# Patient Record
Sex: Male | Born: 1970 | Hispanic: No | Marital: Single | Smoking: Former smoker
Health system: Southern US, Community
[De-identification: ages and names within clinical notes are randomized; demographics above are authoritative.]

---

## 2002-09-20 ENCOUNTER — Emergency Department (HOSPITAL_COMMUNITY): Admission: EM | Admit: 2002-09-20 | Discharge: 2002-09-20 | Payer: Self-pay | Admitting: Emergency Medicine

## 2002-09-20 ENCOUNTER — Encounter: Payer: Self-pay | Admitting: Emergency Medicine

## 2011-04-04 ENCOUNTER — Emergency Department (HOSPITAL_COMMUNITY): Payer: Self-pay

## 2011-04-04 ENCOUNTER — Emergency Department (HOSPITAL_COMMUNITY)
Admission: EM | Admit: 2011-04-04 | Discharge: 2011-04-05 | Disposition: A | Discharge status: Home / Self Care | Payer: No Typology Code available for payment source | Attending: Emergency Medicine | Admitting: Emergency Medicine

## 2011-04-04 DIAGNOSIS — R071 Chest pain on breathing: Secondary | ICD-10-CM | POA: Insufficient documentation

## 2011-04-04 DIAGNOSIS — S20219A Contusion of unspecified front wall of thorax, initial encounter: Secondary | ICD-10-CM | POA: Insufficient documentation
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
## 2011-04-04 NOTE — ED Notes (Signed)
[employer] at bedside. Language line being accessed. PT resting; cardiac monitor on. VSS.

## 2011-04-04 NOTE — ED Notes (Addendum)
Language line interpretation unsuccessful. Pt speaks [place] dialect and language line only has traditional [place] interpreter.

## 2011-04-05 MED ORDER — HYDROCODONE-ACETAMINOPHEN 5-325 MG PO TABS: 2.0000 | ORAL_TABLET | ORAL | Status: AC | PRN

## 2011-04-05 NOTE — ED Notes (Addendum)
RN tried another attempt to communicate with pt via language line with moderate success. Discharge instructions given. No questions at this time.

## 2011-04-05 NOTE — ED Notes (Signed)
Pt ambulated with a steady gait; VSS; no signs of distress; skin warm and dry; respirations even and unlabored.

## 2011-04-11 ENCOUNTER — Emergency Department (HOSPITAL_COMMUNITY)
Admission: EM | Admit: 2011-04-11 | Discharge: 2011-04-11 | Disposition: A | Discharge status: Home / Self Care | Payer: No Typology Code available for payment source | Attending: Emergency Medicine | Admitting: Emergency Medicine

## 2011-04-11 ENCOUNTER — Encounter (HOSPITAL_COMMUNITY): Payer: Self-pay | Admitting: *Deleted

## 2011-04-11 DIAGNOSIS — M542 Cervicalgia: Secondary | ICD-10-CM | POA: Insufficient documentation

## 2011-04-11 DIAGNOSIS — M62838 Other muscle spasm: Secondary | ICD-10-CM

## 2011-04-11 DIAGNOSIS — M25519 Pain in unspecified shoulder: Secondary | ICD-10-CM | POA: Insufficient documentation

## 2011-04-11 DIAGNOSIS — R209 Unspecified disturbances of skin sensation: Secondary | ICD-10-CM | POA: Insufficient documentation

## 2011-04-11 DIAGNOSIS — R079 Chest pain, unspecified: Secondary | ICD-10-CM | POA: Insufficient documentation

## 2011-04-11 MED ORDER — CYCLOBENZAPRINE HCL 10 MG PO TABS: 10.0000 mg | ORAL_TABLET | Freq: Two times a day (BID) | ORAL | Status: AC | PRN

## 2011-04-11 MED ORDER — NAPROXEN 500 MG PO TABS: 500.0000 mg | ORAL_TABLET | Freq: Two times a day (BID) | ORAL | Status: AC
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
## 2015-08-29 ENCOUNTER — Encounter (HOSPITAL_COMMUNITY): Payer: Self-pay | Admitting: Adult Health

## 2015-08-29 DIAGNOSIS — Z87891 Personal history of nicotine dependence: Secondary | ICD-10-CM | POA: Diagnosis not present

## 2015-08-29 DIAGNOSIS — S6401XA Injury of ulnar nerve at wrist and hand level of right arm, initial encounter: Secondary | ICD-10-CM | POA: Diagnosis not present

## 2015-08-29 DIAGNOSIS — S56521A Laceration of other extensor muscle, fascia and tendon at forearm level, right arm, initial encounter: Secondary | ICD-10-CM | POA: Diagnosis not present

## 2015-08-29 DIAGNOSIS — S6421XA Injury of radial nerve at wrist and hand level of right arm, initial encounter: Secondary | ICD-10-CM | POA: Diagnosis not present

## 2015-08-29 DIAGNOSIS — W25XXXA Contact with sharp glass, initial encounter: Secondary | ICD-10-CM | POA: Insufficient documentation

## 2015-08-29 DIAGNOSIS — S51811A Laceration without foreign body of right forearm, initial encounter: Secondary | ICD-10-CM | POA: Diagnosis present

## 2015-08-29 NOTE — ED Notes (Signed)
preswnts with laceration to right posterior forearm CMS intact, occurred from a window. Unknown tetanus.

## 2015-08-30 ENCOUNTER — Ambulatory Visit (HOSPITAL_COMMUNITY)
Admission: EM | Admit: 2015-08-30 | Discharge: 2015-08-30 | Disposition: A | Discharge status: Home / Self Care | Payer: PRIVATE HEALTH INSURANCE | Attending: Emergency Medicine | Admitting: Emergency Medicine

## 2015-08-30 ENCOUNTER — Emergency Department (HOSPITAL_COMMUNITY): Payer: PRIVATE HEALTH INSURANCE

## 2015-08-30 ENCOUNTER — Emergency Department (HOSPITAL_COMMUNITY): Payer: PRIVATE HEALTH INSURANCE | Admitting: Anesthesiology

## 2015-08-30 ENCOUNTER — Encounter (HOSPITAL_COMMUNITY)
Admission: EM | Disposition: A | Discharge status: Home / Self Care | Payer: Self-pay | Source: Home / Self Care | Attending: Emergency Medicine

## 2015-08-30 ENCOUNTER — Encounter (HOSPITAL_COMMUNITY): Payer: Self-pay | Admitting: Critical Care Medicine

## 2015-08-30 DIAGNOSIS — S51811A Laceration without foreign body of right forearm, initial encounter: Secondary | ICD-10-CM

## 2015-08-30 DIAGNOSIS — S56921A Laceration of unspecified muscles, fascia and tendons at forearm level, right arm, initial encounter: Secondary | ICD-10-CM

## 2015-08-30 HISTORY — PX: TENDON REPAIR: SHX5111

## 2015-08-30 SURGERY — TENDON REPAIR
Anesthesia: General | Site: Arm Lower | Laterality: Right

## 2015-08-30 MED ORDER — LIDOCAINE HCL (CARDIAC) 20 MG/ML IV SOLN
INTRAVENOUS | Status: DC | PRN
  Administered 2015-08-30: 50 mg via INTRAVENOUS

## 2015-08-30 MED ORDER — SUCCINYLCHOLINE CHLORIDE 200 MG/10ML IV SOSY
PREFILLED_SYRINGE | INTRAVENOUS | Status: AC
  Filled 2015-08-30: qty 10

## 2015-08-30 MED ORDER — LACTATED RINGERS IV SOLN
INTRAVENOUS | Status: DC | PRN
  Administered 2015-08-30: 07:00:00 via INTRAVENOUS

## 2015-08-30 MED ORDER — LIDOCAINE 2% (20 MG/ML) 5 ML SYRINGE
INTRAMUSCULAR | Status: AC
  Filled 2015-08-30: qty 5

## 2015-08-30 MED ORDER — ACETAMINOPHEN 160 MG/5ML PO SOLN
325.0000 mg | ORAL | Status: DC | PRN
  Filled 2015-08-30: qty 20.3

## 2015-08-30 MED ORDER — FENTANYL CITRATE (PF) 100 MCG/2ML IJ SOLN
INTRAMUSCULAR | Status: DC | PRN
  Administered 2015-08-30: 100 ug via INTRAVENOUS

## 2015-08-30 MED ORDER — MIDAZOLAM HCL 2 MG/2ML IJ SOLN
INTRAMUSCULAR | Status: AC
  Filled 2015-08-30: qty 2

## 2015-08-30 MED ORDER — FENTANYL CITRATE (PF) 100 MCG/2ML IJ SOLN: 25.0000 ug | INTRAMUSCULAR | Status: DC | PRN

## 2015-08-30 MED ORDER — CEFAZOLIN SODIUM 1 G IJ SOLR
INTRAMUSCULAR | Status: DC | PRN
  Administered 2015-08-30: 2 g via INTRAMUSCULAR

## 2015-08-30 MED ORDER — OXYCODONE-ACETAMINOPHEN 5-325 MG PO TABS: 1.0000 | ORAL_TABLET | ORAL | Status: DC | PRN

## 2015-08-30 MED ORDER — LIDOCAINE-EPINEPHRINE (PF) 1 %-1:200000 IJ SOLN
20.0000 mL | Freq: Once | INTRAMUSCULAR | Status: DC
  Filled 2015-08-30: qty 20

## 2015-08-30 MED ORDER — ONDANSETRON HCL 4 MG/2ML IJ SOLN
INTRAMUSCULAR | Status: AC
  Filled 2015-08-30: qty 2

## 2015-08-30 MED ORDER — ONDANSETRON HCL 4 MG/2ML IJ SOLN
INTRAMUSCULAR | Status: DC | PRN
  Administered 2015-08-30: 4 mg via INTRAVENOUS

## 2015-08-30 MED ORDER — PROPOFOL 10 MG/ML IV BOLUS
INTRAVENOUS | Status: AC
  Filled 2015-08-30: qty 20

## 2015-08-30 MED ORDER — BUPIVACAINE HCL (PF) 0.25 % IJ SOLN
INTRAMUSCULAR | Status: AC
  Filled 2015-08-30: qty 30

## 2015-08-30 MED ORDER — DEXAMETHASONE SODIUM PHOSPHATE 10 MG/ML IJ SOLN
INTRAMUSCULAR | Status: AC
  Filled 2015-08-30: qty 1

## 2015-08-30 MED ORDER — ACETAMINOPHEN 325 MG PO TABS: 325.0000 mg | ORAL_TABLET | ORAL | Status: DC | PRN

## 2015-08-30 MED ORDER — FENTANYL CITRATE (PF) 250 MCG/5ML IJ SOLN
INTRAMUSCULAR | Status: AC
  Filled 2015-08-30: qty 5

## 2015-08-30 MED ORDER — OXYCODONE-ACETAMINOPHEN 5-325 MG PO TABS
ORAL_TABLET | ORAL | Status: AC
  Administered 2015-08-30: 1
  Filled 2015-08-30: qty 1

## 2015-08-30 MED ORDER — PHENYLEPHRINE HCL 10 MG/ML IJ SOLN
INTRAMUSCULAR | Status: DC | PRN
  Administered 2015-08-30 (×5): 80 ug via INTRAVENOUS

## 2015-08-30 MED ORDER — BUPIVACAINE HCL (PF) 0.25 % IJ SOLN
INTRAMUSCULAR | Status: DC | PRN
  Administered 2015-08-30: 20 mL

## 2015-08-30 MED ORDER — DEXAMETHASONE SODIUM PHOSPHATE 10 MG/ML IJ SOLN
INTRAMUSCULAR | Status: DC | PRN
  Administered 2015-08-30: 10 mg via INTRAVENOUS

## 2015-08-30 MED ORDER — PHENYLEPHRINE 40 MCG/ML (10ML) SYRINGE FOR IV PUSH (FOR BLOOD PRESSURE SUPPORT)
PREFILLED_SYRINGE | INTRAVENOUS | Status: AC
  Filled 2015-08-30: qty 10

## 2015-08-30 MED ORDER — PROPOFOL 10 MG/ML IV BOLUS
INTRAVENOUS | Status: DC | PRN
Start: 2015-08-30 — End: 2015-08-30
  Administered 2015-08-30: 20 mg via INTRAVENOUS
  Administered 2015-08-30: 110 mg via INTRAVENOUS
  Administered 2015-08-30: 40 mg via INTRAVENOUS

## 2015-08-30 MED ORDER — OXYCODONE HCL 5 MG PO TABS: 5.0000 mg | ORAL_TABLET | Freq: Once | ORAL | Status: DC | PRN

## 2015-08-30 MED ORDER — OXYCODONE HCL 5 MG/5ML PO SOLN: 5.0000 mg | Freq: Once | ORAL | Status: DC | PRN

## 2015-08-30 MED ORDER — SUCCINYLCHOLINE CHLORIDE 20 MG/ML IJ SOLN
INTRAMUSCULAR | Status: DC | PRN
  Administered 2015-08-30: 40 mg via INTRAVENOUS

## 2015-08-30 MED ORDER — TETANUS-DIPHTH-ACELL PERTUSSIS 5-2.5-18.5 LF-MCG/0.5 IM SUSP
0.5000 mL | Freq: Once | INTRAMUSCULAR | Status: AC
  Administered 2015-08-30: 0.5 mL via INTRAMUSCULAR
  Filled 2015-08-30: qty 0.5

## 2015-08-30 MED ORDER — ROCURONIUM BROMIDE 50 MG/5ML IV SOLN
INTRAVENOUS | Status: AC
  Filled 2015-08-30: qty 1

## 2015-08-30 MED ORDER — HYDROMORPHONE HCL 1 MG/ML IJ SOLN
1.0000 mg | Freq: Once | INTRAMUSCULAR | Status: AC
  Administered 2015-08-30: 1 mg via INTRAVENOUS
  Filled 2015-08-30: qty 1

## 2015-08-30 MED ORDER — ONDANSETRON HCL 4 MG/2ML IJ SOLN
4.0000 mg | Freq: Once | INTRAMUSCULAR | Status: AC
  Administered 2015-08-30: 4 mg via INTRAVENOUS
  Filled 2015-08-30: qty 2

## 2015-08-30 SURGICAL SUPPLY — 60 items
BANDAGE ACE 3X5.8 VEL STRL LF (GAUZE/BANDAGES/DRESSINGS) ×2 IMPLANT
BANDAGE ELASTIC 3 VELCRO ST LF (GAUZE/BANDAGES/DRESSINGS) IMPLANT
BANDAGE ELASTIC 4 VELCRO ST LF (GAUZE/BANDAGES/DRESSINGS) IMPLANT
BNDG COHESIVE 1X5 TAN STRL LF (GAUZE/BANDAGES/DRESSINGS) IMPLANT
BNDG ESMARK 4X9 LF (GAUZE/BANDAGES/DRESSINGS) ×2 IMPLANT
BNDG GAUZE ELAST 4 BULKY (GAUZE/BANDAGES/DRESSINGS) IMPLANT
CORDS BIPOLAR (ELECTRODE) ×2 IMPLANT
COVER SURGICAL LIGHT HANDLE (MISCELLANEOUS) ×2 IMPLANT
CUFF TOURNIQUET SINGLE 18IN (TOURNIQUET CUFF) IMPLANT
DECANTER SPIKE VIAL GLASS SM (MISCELLANEOUS) ×2 IMPLANT
DRAPE OEC MINIVIEW 54X84 (DRAPES) IMPLANT
DRAPE SURG 17X23 STRL (DRAPES) ×2 IMPLANT
GAUZE SPONGE 2X2 8PLY STRL LF (GAUZE/BANDAGES/DRESSINGS) IMPLANT
GAUZE SPONGE 4X4 12PLY STRL (GAUZE/BANDAGES/DRESSINGS) ×2 IMPLANT
GAUZE XEROFORM 1X8 LF (GAUZE/BANDAGES/DRESSINGS) ×2 IMPLANT
GLOVE BIO SURGEON STRL SZ7.5 (GLOVE) ×2 IMPLANT
GLOVE BIOGEL PI IND STRL 7.0 (GLOVE) ×1 IMPLANT
GLOVE BIOGEL PI IND STRL 8 (GLOVE) ×1 IMPLANT
GLOVE BIOGEL PI INDICATOR 7.0 (GLOVE) ×1
GLOVE BIOGEL PI INDICATOR 8 (GLOVE) ×1
GOWN STRL REUS W/ TWL LRG LVL3 (GOWN DISPOSABLE) ×2 IMPLANT
GOWN STRL REUS W/ TWL XL LVL3 (GOWN DISPOSABLE) ×1 IMPLANT
GOWN STRL REUS W/TWL LRG LVL3 (GOWN DISPOSABLE) ×2
GOWN STRL REUS W/TWL XL LVL3 (GOWN DISPOSABLE) ×1
GUIDE NRV 3X2RSRB NEURAGEN 2M (Tissue) ×1 IMPLANT
KIT BASIN OR (CUSTOM PROCEDURE TRAY) ×2 IMPLANT
KIT ROOM TURNOVER OR (KITS) ×2 IMPLANT
MANIFOLD NEPTUNE II (INSTRUMENTS) IMPLANT
NEEDLE HYPO 25GX1X1/2 BEV (NEEDLE) ×4 IMPLANT
NERVE GUIDE NEURAGEN 2MM (Tissue) ×1 IMPLANT
NS IRRIG 1000ML POUR BTL (IV SOLUTION) ×2 IMPLANT
PACK ORTHO EXTREMITY (CUSTOM PROCEDURE TRAY) ×2 IMPLANT
PAD ARMBOARD 7.5X6 YLW CONV (MISCELLANEOUS) ×4 IMPLANT
PAD CAST 3X4 CTTN HI CHSV (CAST SUPPLIES) ×1 IMPLANT
PAD CAST 4YDX4 CTTN HI CHSV (CAST SUPPLIES) IMPLANT
PADDING CAST ABS 4INX4YD NS (CAST SUPPLIES) ×1
PADDING CAST ABS COTTON 4X4 ST (CAST SUPPLIES) ×1 IMPLANT
PADDING CAST COTTON 3X4 STRL (CAST SUPPLIES) ×1
PADDING CAST COTTON 4X4 STRL (CAST SUPPLIES)
PENCIL BUTTON BLDE SNGL 10FT (ELECTRODE) ×2 IMPLANT
SOAP 2 % CHG 4 OZ (WOUND CARE) ×2 IMPLANT
SPECIMEN JAR SMALL (MISCELLANEOUS) IMPLANT
SPLINT FIBERGLASS 3X12 (CAST SUPPLIES) ×2 IMPLANT
SPONGE GAUZE 2X2 STER 10/PKG (GAUZE/BANDAGES/DRESSINGS)
SPONGE SCRUB IODOPHOR (GAUZE/BANDAGES/DRESSINGS) IMPLANT
SUCTION FRAZIER HANDLE 10FR (MISCELLANEOUS) ×1
SUCTION TUBE FRAZIER 10FR DISP (MISCELLANEOUS) ×1 IMPLANT
SUT ETHILON 4 0 PS 2 18 (SUTURE) ×4 IMPLANT
SUT ETHILON 8 0 BV130 4 (SUTURE) ×4 IMPLANT
SUT FIBERWIRE 3-0 18 TAPR NDL (SUTURE) ×2
SUT MERSILENE 4 0 P 3 (SUTURE) IMPLANT
SUT PROLENE 4 0 PS 2 18 (SUTURE) IMPLANT
SUT VIC AB 2-0 CT1 27 (SUTURE)
SUT VIC AB 2-0 CT1 TAPERPNT 27 (SUTURE) IMPLANT
SUTURE FIBERWR 3-0 18 TAPR NDL (SUTURE) ×1 IMPLANT
SYR CONTROL 10ML LL (SYRINGE) ×2 IMPLANT
TOWEL OR 17X24 6PK STRL BLUE (TOWEL DISPOSABLE) ×2 IMPLANT
TOWEL OR 17X26 10 PK STRL BLUE (TOWEL DISPOSABLE) ×2 IMPLANT
TUBE CONNECTING 12X1/4 (SUCTIONS) IMPLANT
UNDERPAD 30X30 INCONTINENT (UNDERPADS AND DIAPERS) ×2 IMPLANT

## 2015-08-30 NOTE — Op Note (Signed)
NAME:  Bruce Hawkins, Y                       ACCOUNT NO.:  [id number]  MEDICAL RECORD NO.:  [id number]  LOCATION:  [facility]                         FACILITY:  [facility]  PHYSICIAN:  Johnette AbrahamHarrill C Torria Fromer, MD    DATE OF BIRTH:  09/10/1970  DATE OF PROCEDURE:  08/30/2015 DATE OF DISCHARGE:                              OPERATIVE REPORT   PREOPERATIVE DIAGNOSIS:  Complex lacerations to the right wrist and right forearm with presumed tendon and nerve damage.  POSTOPERATIVE DIAGNOSIS:  Complex lacerations to the right wrist and right forearm with presumed tendon and nerve damage.  PROCEDURE:  Exploration of complex wounds x2. Repair of extensor carpi radialis tendon, repair of the extensor pollicis brevis tendon, repair of radial sensory nerve, and limited forearm fasciotomy.  ANESTHESIA:  General.  No acute complications.  INDICATIONS:  Mr. Marga HootsSiu is a 45 year old gentleman who was opening a plate glass window when somehow his hand slipped through the glass broke.  His hand went through the glass sustaining complex lacerations as described above.  All risks, benefits, alternatives of surgery and fixation were discussed with him and his family.  He agreed with this course of action.  Consent was obtained.  PROCEDURE IN DETAIL:  The patient was taken to the operating room, placed supine on the operating table.  Time-out was performed.  General anesthesia was administered without difficulty.  The right upper extremity was prepped and draped in normal sterile fashion.  Tourniquet was used.  The arm was exsanguinated.  Tourniquet was inflated to 250 mmHg.  The radial most wound was explored first.  There was a very stellate gaping wound.  It was obvious that there were 2 tendon structures that were damaged.  It appeared to be that the extensor pollicis brevis tendon as well as the extensor carpi radialis tendons were about 70% lacerated.  These tendons were freed up and repaired with 3-0 FiberWire.  Next, it  was evident that the radial sensory nerve as it exited the deep fascia was also completely lacerated.  The proximal end was frayed and therefore not suitable for primary repair.  A NeuraGen 2 mm x 3 cm conduit tube was used.  Both ends of the nerve were trimmed and placed in the tube and then the nerve was sutured to the tube holding it in place.  Following the more volar wound was explored.  This was right over the ulnar artery and ulnar nerve.  The ulnar nerve was visualized and somewhat bruised.  However, it was intact.  The tendinous structures around this wound were also explored and the flexor tendons were without injury.  Both wounds were irrigated with irrigation solution and local advancement flaps were used to close the stellate lacerations that were very complex in nature.  These wounds were closed in layers. After general full-thickness debridement and washout was performed, sterile dressing and a splint were applied.  After the tourniquet was released, all finger tips were nice and pink.     Johnette AbrahamHarrill C Meliana Canner, MD     HCC/MEDQ  D:  08/30/2015  T:  08/30/2015  Job:  [id number]

## 2015-08-30 NOTE — Anesthesia Postprocedure Evaluation (Signed)
Anesthesia Post Note  Patient: Bruce Hawkins  Procedure(s) Performed: Procedure(s) (LRB): EXPLORATION OF WOUND POSSIBLE RIGHT TENDON REPAIR AS NEEDED (Right)  Patient location during evaluation: PACU Anesthesia Type: General Level of consciousness: awake, awake and alert and oriented Pain management: pain level controlled Vital Signs Assessment: post-procedure vital signs reviewed and stable Respiratory status: spontaneous breathing, nonlabored ventilation and respiratory function stable Cardiovascular status: blood pressure returned to baseline Anesthetic complications: no    Last Vitals:  Filed Vitals:   08/30/15 0900 08/30/15 0902  BP:  141/96  Pulse: 73 72  Temp: 36.6 C   Resp: 16 16    Last Pain:  Filed Vitals:   08/30/15 0939  PainSc: Asleep                 Meighan Treto COKER
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
## 2015-08-30 NOTE — Anesthesia Procedure Notes (Signed)
Procedure Name: Intubation Date/Time: 08/30/2015 6:53 AM Performed by: Glo HerringLEE, Mazikeen Hehn B Pre-anesthesia Checklist: Patient identified, Emergency Drugs available, Timeout performed, Patient being monitored and Suction available Patient Re-evaluated:Patient Re-evaluated prior to inductionOxygen Delivery Method: Circle system utilized Preoxygenation: Pre-oxygenation with 100% oxygen Intubation Type: IV induction Ventilation: Mask ventilation without difficulty Laryngoscope Size: Mac and 3 Grade View: Grade I Tube type: Oral Tube size: 7.0 mm Number of attempts: 1 Airway Equipment and Method: Stylet Placement Confirmation: CO2 detector,  positive ETCO2,  ETT inserted through vocal cords under direct vision and breath sounds checked- equal and bilateral Secured at: 21 cm Tube secured with: Tape Dental Injury: Teeth and Oropharynx as per pre-operative assessment

## 2015-08-30 NOTE — Anesthesia Preprocedure Evaluation (Signed)
Anesthesia Evaluation  Patient identified by MRN, date of birth, ID band Patient awake    Reviewed: Allergy & Precautions, NPO status , Patient's Chart, lab work & pertinent test results  History of Anesthesia Complications (+) history of anesthetic complications  Airway Mallampati: II  TM Distance: >3 FB Neck ROM: Full    Dental  (+) Teeth Intact   Pulmonary former smoker,    breath sounds clear to auscultation       Cardiovascular negative cardio ROS   Rhythm:Regular     Neuro/Psych negative neurological ROS  negative psych ROS   GI/Hepatic negative GI ROS, Neg liver ROS,   Endo/Other  negative endocrine ROS  Renal/GU negative Renal ROS     Musculoskeletal   Abdominal   Peds  Hematology negative hematology ROS (+)   Anesthesia Other Findings   Reproductive/Obstetrics                             Anesthesia Physical Anesthesia Plan  ASA: I  Anesthesia Plan: General   Post-op Pain Management:    Induction: Intravenous and Rapid sequence  Airway Management Planned: Oral ETT  Additional Equipment: None  Intra-op Plan:   Post-operative Plan: Extubation in OR  Informed Consent: I have reviewed the patients History and Physical, chart, labs and discussed the procedure including the risks, benefits and alternatives for the proposed anesthesia with the patient or authorized representative who has indicated his/her understanding and acceptance.   Dental advisory given  Plan Discussed with: Surgeon and CRNA  Anesthesia Plan Comments:         Anesthesia Quick Evaluation

## 2015-08-30 NOTE — H&P (Signed)
Reason for Consult:laceration Referring Physician: ER  CC:I cut my arm  HPI:  Bruce Hawkins is an 45 Bruce.o. right handed male who presents with  Complex lacerations to r forearm.  Pt was opening a window when hand went through glass .   Pain is rated at  10  /10 and is described as sharp.  Pain is constant.  Pain is made better by rest/immobilization, worse with motion.   Associated signs/symptoms: denies numbness Previous treatment:    History reviewed. No pertinent past medical history.  History reviewed. No pertinent past surgical history.  History reviewed. No pertinent family history.  Social History:  reports that he has quit smoking. He does not have any smokeless tobacco history on file. He reports that he does not drink alcohol or use illicit drugs.  Allergies: No Known Allergies  Medications: I have reviewed the patient's current medications.  No results found for this or any previous visit (from the past 48 hour(s)).  Dg Wrist Complete Right  08/30/2015  CLINICAL DATA:  Wrist laceration from window. EXAM: RIGHT WRIST - COMPLETE 3+ VIEW COMPARISON:  None. FINDINGS: There is no evidence of fracture or dislocation. There is no evidence of arthropathy or other focal bone abnormality. Soft tissue defect volar aspect is of distal radius soft tissues with subcutaneous gas. IMPRESSION: Distal radial soft tissue laceration without acute osseous process. Electronically Signed   By: Awilda Metroourtnay  Bloomer M.D.   On: 08/30/2015 05:05    Pertinent items are noted in HPI. Temp:  [98.2 F (36.8 C)] 98.2 F (36.8 C) (05/20 2325) Pulse Rate:  [71] 71 (05/20 2325) Resp:  [18] 18 (05/20 2325) BP: (103)/(73) 103/73 mmHg (05/20 2325) SpO2:  [97 %] 97 % (05/20 2325) Weight:  [64.609 kg (142 lb 7 oz)] 64.609 kg (142 lb 7 oz) (05/20 2325) General appearance: alert and cooperative Resp: clear to auscultation bilaterally Cardio: regular rate and rhythm GI: soft, non-tender; bowel sounds normal; no  masses,  no organomegaly Extremities: extremities normal, atraumatic, no cyanosis or edema  Except for R forearm: deep complex laceration of distal radial forearm, and ulnar volar wrist, able to flex extend all digits, figner tips pink with good cap refill   Assessment: Complex lacerations of R forearm/wrist, ? Tendon involvement Plan: Will wash out, explore, repair damaged structures as needed. I have discussed this treatment plan in detail with patient and family, including the risks of the recommended treatment or surgery, the benefits and the alternatives.  The patient and caregiver understands that additional treatment may be necessary.  Bruce Hawkins Bruce Hawkins 08/30/2015, 6:47 AM

## 2015-08-30 NOTE — Transfer of Care (Signed)
Immediate Anesthesia Transfer of Care Note  Patient: Bruce Hawkins  Procedure(s) Performed: Procedure(s): EXPLORATION OF WOUND POSSIBLE RIGHT TENDON REPAIR AS NEEDED (Right)  Patient Location: PACU  Anesthesia Type:General  Level of Consciousness: awake and alert   Airway & Oxygen Therapy: Patient Spontanous Breathing and Patient connected to nasal cannula oxygen  Post-op Assessment: Report given to RN and Post -op Vital signs reviewed and stable  Post vital signs: Reviewed and stable  Last Vitals:  Filed Vitals:   08/29/15 2325  BP: 103/73  Pulse: 71  Temp: 36.8 C  Resp: 18    Last Pain:  Filed Vitals:   08/30/15 0426  PainSc: 10-Worst pain ever         Complications: No apparent anesthesia complications

## 2015-08-30 NOTE — Discharge Instructions (Signed)
Discharge Instructions:  Keep your dressing clean, dry and in place until instructed to remove by Dr. Samyah Bilbo.  If the dressing becomes dirty or wet call the office for instructions during business hours. Elevate the extremity to help with swelling, this will also help with any discomfort. Take your medication as prescribed. No lifting with the injured  extremity. If you feel that the dressing is too tight, you may loosen it, but keep it on; finger tips should be pink; if there is a concern, call the office. [phone] Ice may be used if the injury is a fracture, do not apply ice directly to the skin. Please call the office on the next business day after discharge to arrange a follow up appointment.  Call [phone] between the hours of 9am - 5pm M-Th or 9am - 1pm on Fri. For most hand injuries and/or conditions, you may return to work using the uninjured hand (one handed duty) within 24-72 hours.  A detailed note will be provided to you at your follow up appointment or may contact the office prior to your follow up.    

## 2015-09-01 ENCOUNTER — Encounter (HOSPITAL_COMMUNITY): Payer: Self-pay | Admitting: General Surgery

## 2015-09-14 ENCOUNTER — Emergency Department (HOSPITAL_COMMUNITY)
Admission: EM | Admit: 2015-09-14 | Discharge: 2015-09-14 | Disposition: A | Discharge status: Home / Self Care | Payer: PRIVATE HEALTH INSURANCE | Attending: Emergency Medicine | Admitting: Emergency Medicine

## 2015-09-14 DIAGNOSIS — S59911D Unspecified injury of right forearm, subsequent encounter: Secondary | ICD-10-CM | POA: Diagnosis not present

## 2015-09-14 DIAGNOSIS — S6992XD Unspecified injury of left wrist, hand and finger(s), subsequent encounter: Secondary | ICD-10-CM | POA: Diagnosis present

## 2015-09-14 DIAGNOSIS — Z5189 Encounter for other specified aftercare: Secondary | ICD-10-CM

## 2015-09-14 DIAGNOSIS — Z4801 Encounter for change or removal of surgical wound dressing: Secondary | ICD-10-CM | POA: Insufficient documentation

## 2015-09-14 DIAGNOSIS — S61511D Laceration without foreign body of right wrist, subsequent encounter: Secondary | ICD-10-CM

## 2015-09-14 DIAGNOSIS — W268XXD Contact with other sharp object(s), not elsewhere classified, subsequent encounter: Secondary | ICD-10-CM | POA: Insufficient documentation

## 2015-09-14 NOTE — Discharge Instructions (Signed)
Go to Dr. Debby Budoley's office in two days (on Wednesday June 7th, 2017) at 3:30PM for a recheck of your wound.

## 2015-09-14 NOTE — ED Notes (Signed)
Declined W/C at D/C and was escorted to lobby by RN. 

## 2015-09-14 NOTE — ED Provider Notes (Signed)
CSN: [id number]     Arrival date & time 09/14/15  [id number] History  By signing my name below, I, Hollace HaywardAndrew Hiatt, attest that this documentation has been prepared under the direction and in the presence of Renne CriglerJoshua Altagracia Rone, PA-C.   Electronically Signed: Hollace HaywardAndrew Hiatt, ED Scribe. 09/14/2015. 9:26 AM.  Chief Complaint  Patient presents with  . Hand Injury   The history is provided by the patient. The history is limited by a language barrier. A language interpreter was used [place]).   HPI Comments: Bruce Hawkins is a 45 y.o. male who presents to the Emergency Department for a R arm laceration recheck from an injury that occured 08/30/2015. Pt reports that he stuck his hand through a broken window when he sustained the laceration to his R arm.Pt was seen on 08/30/15 in the ED for his initial injuries by this provider. Patient was brought to the operating room for washout of wounds. Patient was found to have 2 tendons that were 70% lacerated as well as nerve laceration.  Pt denies any broken bones. He is currently on antibiotics for his injury. Pt reports that he cannot move his hand full ROM. Pt does not remember the doctor who initially performed his surgery. States that he was supposed to follow-up today. Requesting note to return to work.  No past medical history on file. No past surgical history on file. No family history on file. Social History  Substance Use Topics  . Smoking status: Not on file  . Smokeless tobacco: Not on file  . Alcohol Use: No    Review of Systems  Constitutional: Negative for fever and activity change.  Musculoskeletal: Positive for arthralgias. Negative for back pain, joint swelling, gait problem and neck pain.  Skin: Positive for wound (R forearm).  Neurological: Positive for weakness (R wrist). Negative for numbness.     Allergies  Review of patient's allergies indicates no known allergies.  Home Medications   Prior to Admission medications   Not on File   BP  152/96 mmHg  Pulse 78  Temp(Src) 98.9 F (37.2 C) (Oral)  Resp 16  SpO2 98%   Physical Exam  Constitutional: He appears well-developed and well-nourished.  HENT:  Head: Normocephalic and atraumatic.  Eyes: Conjunctivae are normal.  Neck: Normal range of motion. Neck supple.  Cardiovascular: Normal rate and normal pulses.   Pulmonary/Chest: Effort normal. No respiratory distress.  Abdominal: He exhibits no distension.  Musculoskeletal: He exhibits tenderness. He exhibits no edema.  Neurological: He is alert. No sensory deficit.  Vascular distal to the injury is fully intact. Patient has weakness with flexion and extension of the R wrist.   Skin: Skin is warm and dry.  2 lacerations (radial and volar) well healing with sutures in place. No evidence of infection. No drainage.   Psychiatric: He has a normal mood and affect. His behavior is normal.  Nursing note and vitals reviewed.   ED Course  Procedures (including critical care time)  DIAGNOSTIC STUDIES: Oxygen Saturation is 98% on RA, normal by my interpretation.   COORDINATION OF CARE: 9:20 AM-Discussed next steps with pt including f/u w/ his hand surgeon and continued splint usage. Radial gutter splint replaced by ortho tech. Pt verbalized understanding and is agreeable with the plan.   Vital signs reviewed and are as follows: Filed Vitals:   09/14/15 0911  BP: 152/96  Pulse: 78  Temp: 98.9 F (37.2 C)  Resp: 16   10:16 AM I called Dr. Debby Budoley's office. Patient  was supposed to have follow-up appointment this morning at 9 AM. I have rescheduled a follow-up appointment in 2 days, on 09/16/15, at 3:30 PM. Patient was given the address and phone number of Dr. Debby Bud office as well as the appointment time. He verbalizes understanding that he is supposed to go to this appointment then. Will not clear for return to work.     MDM   Final diagnoses:  Laceration of right wrist with complication, subsequent encounter   Encounter for wound re-check   Patient here for wound recheck. Patient mistakenly came to the emergency department instead of going to the orthopedic surgeon's office. Appointment rescheduled. Wound examined and splint replaced. Patient continues to have weakness in this wrist.  I personally performed the services described in this documentation, which was scribed in my presence. The recorded information has been reviewed and is accurate.     Renne Crigler, PA-C 09/14/15 1017  Lyndal Pulley, MD 09/15/15 [phone]

## 2015-09-14 NOTE — ED Notes (Signed)
Was seen here for lac to  Rt hand now here for recheck

## 2015-09-14 NOTE — Progress Notes (Signed)
Orthopedic Tech Progress Note Patient Details:  Bruce Hawkins 05/22/1970 [id number]  Ortho Devices Type of Ortho Device: Thumb spica splint Ortho Device/Splint Location: rue Ortho Device/Splint Interventions: Application   Ama Mcmaster 09/14/2015, 10:07 AM

## 2016-07-18 ENCOUNTER — Encounter (HOSPITAL_COMMUNITY): Payer: Self-pay | Admitting: *Deleted

## 2017-05-04 ENCOUNTER — Encounter (HOSPITAL_COMMUNITY): Payer: Self-pay | Admitting: Family Medicine

## 2017-05-04 ENCOUNTER — Ambulatory Visit (HOSPITAL_COMMUNITY)
Admission: EM | Admit: 2017-05-04 | Discharge: 2017-05-04 | Disposition: A | Discharge status: Home / Self Care | Payer: PRIVATE HEALTH INSURANCE | Attending: Family Medicine | Admitting: Family Medicine

## 2017-05-04 DIAGNOSIS — G43001 Migraine without aura, not intractable, with status migrainosus: Secondary | ICD-10-CM

## 2017-05-04 MED ORDER — TOPIRAMATE 50 MG PO TABS: 50.0000 mg | ORAL_TABLET | Freq: Every day | ORAL | 1 refills | Status: AC

## 2017-05-04 NOTE — Discharge Instructions (Signed)
Return if headaches persist.

## 2017-05-04 NOTE — ED Provider Notes (Signed)
  [facility]   [id number] 05/04/17 Arrival Time: 1132   SUBJECTIVE:  Bruce Hawkins is a 47 y.o. male who presents to the urgent care with complaint of neck pain that has been radiating into his head for 3 months. sts also fever. sts some nausea. Denies injury to neck.  No head trauma.  Had left eye injury as child  From [place].  Has own interpreter.  History reviewed. No pertinent past medical history. History reviewed. No pertinent family history. Social History   Socioeconomic History  . Marital status: Single    Spouse name: Not on file  . Number of children: Not on file  . Years of education: Not on file  . Highest education level: Not on file  Social Needs  . Financial resource strain: Not on file  . Food insecurity - worry: Not on file  . Food insecurity - inability: Not on file  . Transportation needs - medical: Not on file  . Transportation needs - non-medical: Not on file  Occupational History  . Not on file  Tobacco Use  . Smoking status: Former Smoker  Substance and Sexual Activity  . Alcohol use: No  . Drug use: No  . Sexual activity: Not on file  Other Topics Concern  . Not on file  Social History Narrative   ** Merged History Encounter **       No outpatient medications have been marked as taking for the 05/04/17 encounter [facility](Hospital Encounter).   No Known Allergies    ROS: As per HPI, remainder of ROS negative.   OBJECTIVE:   Vitals:   05/04/17 1206  BP: (!) 149/89  Pulse: 69  Resp: 18  Temp: 98.3 F (36.8 C)  SpO2: 100%     General appearance: alert; no distress Eyes: PERRL; EOMI; conjunctiva normal;  Corneal scar OS; fundi normal HENT: normocephalic; atraumatic; TMs normal, canal normal, external ears normal without trauma; nasal mucosa normal; oral mucosa normal Neck: supple Lungs: clear to auscultation bilaterally Heart: regular rate and rhythm Back: no CVA tenderness Extremities: no cyanosis or edema; symmetrical with  no gross deformities Skin: warm and dry Neurologic: normal gait; grossly normal Psychological: alert and cooperative; normal mood and affect     ASSESSMENT & PLAN:  1. Migraine without aura and with status migrainosus, not intractable     Meds ordered this encounter  Medications  . topiramate (TOPAMAX) 50 MG tablet    Sig: Take 1 tablet (50 mg total) by mouth at bedtime.    Dispense:  30 tablet    Refill:  1    Reviewed expectations re: course of current medical issues. Questions answered. Outlined signs and symptoms indicating need for more acute intervention. Patient verbalized understanding. After Visit Summary given.    Procedures:      Elvina SidleLauenstein, Macayla Ekdahl, MD 05/04/17 1253

## 2017-05-04 NOTE — ED Triage Notes (Signed)
Per pt translator. Pt has neck pain that has been radiating into his head for 3 months. sts also fever. sts some nausea. Denies injury to neck.

## 2020-04-20 ENCOUNTER — Emergency Department (HOSPITAL_COMMUNITY)
Admission: EM | Admit: 2020-04-20 | Discharge: 2020-04-20 | Disposition: A | Discharge status: Home / Self Care | Payer: Self-pay | Attending: Emergency Medicine | Admitting: Emergency Medicine

## 2020-04-20 ENCOUNTER — Emergency Department (HOSPITAL_COMMUNITY): Payer: Self-pay

## 2020-04-20 ENCOUNTER — Encounter (HOSPITAL_COMMUNITY): Payer: Self-pay | Admitting: Emergency Medicine

## 2020-04-20 DIAGNOSIS — M791 Myalgia, unspecified site: Secondary | ICD-10-CM | POA: Insufficient documentation

## 2020-04-20 DIAGNOSIS — Z20822 Contact with and (suspected) exposure to covid-19: Secondary | ICD-10-CM | POA: Insufficient documentation

## 2020-04-20 DIAGNOSIS — R945 Abnormal results of liver function studies: Secondary | ICD-10-CM | POA: Insufficient documentation

## 2020-04-20 DIAGNOSIS — R7989 Other specified abnormal findings of blood chemistry: Secondary | ICD-10-CM

## 2020-04-20 DIAGNOSIS — R519 Headache, unspecified: Secondary | ICD-10-CM | POA: Insufficient documentation

## 2020-04-20 DIAGNOSIS — B349 Viral infection, unspecified: Secondary | ICD-10-CM

## 2020-04-20 DIAGNOSIS — R109 Unspecified abdominal pain: Secondary | ICD-10-CM | POA: Insufficient documentation

## 2020-04-20 DIAGNOSIS — Z87891 Personal history of nicotine dependence: Secondary | ICD-10-CM | POA: Insufficient documentation

## 2020-04-20 LAB — CBC
HCT: 47.7 % (ref 39.0–52.0)
Hemoglobin: 16.2 g/dL (ref 13.0–17.0)
MCH: 28.2 pg (ref 26.0–34.0)
MCHC: 34 g/dL (ref 30.0–36.0)
MCV: 83 fL (ref 80.0–100.0)
Platelets: 228 K/uL (ref 150–400)
RBC: 5.75 MIL/uL (ref 4.22–5.81)
RDW: 12.4 % (ref 11.5–15.5)
WBC: 12 K/uL — ABNORMAL HIGH (ref 4.0–10.5)
nRBC: 0 % (ref 0.0–0.2)

## 2020-04-20 LAB — COMPREHENSIVE METABOLIC PANEL WITH GFR
ALT: 265 U/L — ABNORMAL HIGH (ref 0–44)
AST: 143 U/L — ABNORMAL HIGH (ref 15–41)
Albumin: 4.6 g/dL (ref 3.5–5.0)
Alkaline Phosphatase: 91 U/L (ref 38–126)
Anion gap: 22 — ABNORMAL HIGH (ref 5–15)
BUN: 15 mg/dL (ref 6–20)
CO2: 14 mmol/L — ABNORMAL LOW (ref 22–32)
Calcium: 9.2 mg/dL (ref 8.9–10.3)
Chloride: 101 mmol/L (ref 98–111)
Creatinine, Ser: 1.37 mg/dL — ABNORMAL HIGH (ref 0.61–1.24)
GFR, Estimated: >60 mL/min
Glucose, Bld: 104 mg/dL — ABNORMAL HIGH (ref 70–99)
Potassium: 3.2 mmol/L — ABNORMAL LOW (ref 3.5–5.1)
Sodium: 137 mmol/L (ref 135–145)
Total Bilirubin: 1 mg/dL (ref 0.3–1.2)
Total Protein: 8.6 g/dL — ABNORMAL HIGH (ref 6.5–8.1)

## 2020-04-20 LAB — POC SARS CORONAVIRUS 2 AG -  ED: SARS Coronavirus 2 Ag: NEGATIVE

## 2020-04-20 LAB — LIPASE, BLOOD: Lipase: 22 U/L (ref 11–51)

## 2020-04-20 LAB — CK: Total CK: 653 U/L — ABNORMAL HIGH (ref 49–397)

## 2020-04-20 LAB — SARS CORONAVIRUS 2 (TAT 6-24 HRS): SARS Coronavirus 2: NEGATIVE

## 2020-04-20 MED ORDER — IOHEXOL 300 MG/ML  SOLN
100.0000 mL | Freq: Once | INTRAMUSCULAR | Status: AC | PRN
  Administered 2020-04-20: 100 mL via INTRAVENOUS

## 2020-04-20 MED ORDER — SODIUM CHLORIDE 0.9 % IV SOLN: 1000.0000 mL | INTRAVENOUS | Status: DC

## 2020-04-20 MED ORDER — SODIUM CHLORIDE 0.9 % IV BOLUS (SEPSIS)
1000.0000 mL | Freq: Once | INTRAVENOUS | Status: AC
  Administered 2020-04-20: 1000 mL via INTRAVENOUS

## 2020-04-20 MED ORDER — MORPHINE SULFATE (PF) 4 MG/ML IV SOLN
4.0000 mg | Freq: Once | INTRAVENOUS | Status: AC
  Administered 2020-04-20: 4 mg via INTRAVENOUS
  Filled 2020-04-20: qty 1

## 2020-04-20 NOTE — ED Provider Notes (Signed)
[facility] EMERGENCY DEPARTMENT Provider Note   CSN: [id number] Arrival date & time: 04/20/20  0932   [place] translator used  History Chief complaint: Diffuse body pain.  Bruce Hawkins is a 50 y.o. male.  HPI   Patient presented to the ED with complaints of headache and diffuse body pain as well as abdominal pain.  Patient states he had been doing well till this morning he started having pain.  Patient states he hurts all over from his head down.  He is complaining of abdominal pain but specifically states he is hurting all over when asked about his abdomen.  He denies any trouble with cough or sore throat.  No known fevers or chills.  Patient has been vaccinated for COVID.  He has not received his booster test.  History reviewed. No pertinent past medical history.  There are no problems to display for this patient.   Past Surgical History:  Procedure Laterality Date  . TENDON REPAIR Right 08/30/2015   Procedure: EXPLORATION OF WOUND POSSIBLE RIGHT TENDON REPAIR AS NEEDED;  Surgeon: Knute Neu, MD;  Location: [facility] OR;  Service: Plastics;  Laterality: Right;       No family history on file.  Social History   Tobacco Use  . Smoking status: Former Smoker  Substance Use Topics  . Alcohol use: No  . Drug use: No    Home Medications Prior to Admission medications   Medication Sig Start Date End Date Taking? Authorizing Provider  topiramate (TOPAMAX) 50 MG tablet Take 1 tablet (50 mg total) by mouth at bedtime. 05/04/17   Elvina Sidle, MD    Allergies    Patient has no known allergies.  Review of Systems   Review of Systems  All other systems reviewed and are negative.   Physical Exam Updated Vital Signs BP (!) 156/110   Pulse (!) 114   Temp 98.1 F (36.7 C) (Oral)   Resp 16   SpO2 100%   Physical Exam Vitals and nursing note reviewed.  Constitutional:      Appearance: He is well-developed and well-nourished. He is ill-appearing. He is  not toxic-appearing.  HENT:     Head: Normocephalic and atraumatic.     Right Ear: External ear normal.     Left Ear: External ear normal.  Eyes:     General: No scleral icterus.       Right eye: No discharge.        Left eye: No discharge.     Conjunctiva/sclera: Conjunctivae normal.  Neck:     Trachea: No tracheal deviation.  Cardiovascular:     Rate and Rhythm: Normal rate and regular rhythm.     Pulses: Intact distal pulses.  Pulmonary:     Effort: Pulmonary effort is normal. No respiratory distress.     Breath sounds: Normal breath sounds. No stridor. No wheezing or rales.  Abdominal:     General: Bowel sounds are normal. There is no distension.     Palpations: Abdomen is soft.     Tenderness: There is abdominal tenderness. There is no guarding or rebound.     Comments: No peritoneal signs or guarding  Musculoskeletal:        General: No tenderness or edema.     Cervical back: Neck supple.  Skin:    General: Skin is warm and dry.     Findings: No rash.  Neurological:     Mental Status: He is alert.     Cranial Nerves:  No cranial nerve deficit (no facial droop, extraocular movements intact, no slurred speech).     Sensory: No sensory deficit.     Motor: No abnormal muscle tone or seizure activity.     Coordination: Coordination normal.     Deep Tendon Reflexes: Strength normal.  Psychiatric:        Mood and Affect: Mood and affect normal.     ED Results / Procedures / Treatments   Labs (all labs ordered are listed, but only abnormal results are displayed) Labs Reviewed  COMPREHENSIVE METABOLIC PANEL - Abnormal; Notable for the following components:      Result Value   Potassium 3.2 (*)    CO2 14 (*)    Glucose, Bld 104 (*)    Creatinine, Ser 1.37 (*)    Total Protein 8.6 (*)    AST 143 (*)    ALT 265 (*)    Anion gap 22 (*)    All other components within normal limits  CBC - Abnormal; Notable for the following components:   WBC 12.0 (*)    All other  components within normal limits  LIPASE, BLOOD  URINALYSIS, ROUTINE W REFLEX MICROSCOPIC  CK  POC SARS CORONAVIRUS 2 AG -  ED    EKG EKG Interpretation  Date/Time:  Monday April 20 2020 07:45:16 EST Ventricular Rate:  128 PR Interval:  118 QRS Duration: 82 QT Interval:  348 QTC Calculation: 508 R Axis:   70 Text Interpretation: Sinus tachycardia with occasional Premature ventricular complexes Nonspecific ST abnormality Abnormal ECG No previous tracing Confirmed by Linwood Dibbles [phone]) on 04/20/2020 1:28:01 PM   Radiology DG Chest Portable 1 View  Result Date: 04/20/2020 CLINICAL DATA:  Shortness of breath Abdominal pain EXAM: PORTABLE CHEST 1 VIEW COMPARISON:  None. FINDINGS: The heart size and mediastinal contours are within normal limits. Both lungs are clear. The visualized skeletal structures are unremarkable. IMPRESSION: Normal chest radiograph. Electronically Signed   By: Acquanetta Belling M.D.   On: 04/20/2020 09:07    Procedures Procedures (including critical care time)  Medications Ordered in ED Medications  sodium chloride 0.9 % bolus 1,000 mL (has no administration in time range)    Followed by  0.9 %  sodium chloride infusion (has no administration in time range)  morphine 4 MG/ML injection 4 mg (has no administration in time range)    ED Course  I have reviewed the triage vital signs and the nursing notes.  Pertinent labs & imaging results that were available during my care of the patient were reviewed by me and considered in my medical decision making (see chart for details).  Clinical Course as of 04/20/20 1346  Mon Apr 20, 2020  1342 Chest x-ray without acute findings [JK]  1342 White blood cell count elevated 12 [JK]  1343 Rapid COVID test is negative. [JK]  1343 Metabolic panel notable for decreased bicarb as well as elevated LFTs [JK]    Clinical Course User Index [JK] Linwood Dibbles, MD   MDM Rules/Calculators/A&P                         Patient's  diffuse body aches and pains suggestive of possible viral illness such as influenza or COVID.  Rapid COVID test is negative.  Patient has been vaccinated.  Patient does have elevated LFTs.  With patient's complaints of abdominal pain we will proceed with CT scan. If ct negative, sx suggestive of possible viral etiology. Would order confirmatory  covid.  CK added on to rule out acute myositis.  Care turned over to Dr Charm Barges.   Final Clinical Impression(s) / ED Diagnoses pending   Linwood Dibbles, MD 04/20/20 [phone]

## 2020-04-20 NOTE — Discharge Instructions (Addendum)
You were seen in the emergency department for evaluation of generalized pain.  You had lab work, chest x-ray, and a CAT scan of your abdomen and pelvis that did not show an obvious explanation for your symptoms.  Your rapid COVID test was negative and a second COVID test was sent out and should result in the next 24 hours.  You can follow this up in MyChart.  Your LFTs were also mildly elevated and will need to be followed up with your primary care doctor.  Please return to the emergency department if any worsening or concerning symptoms.

## 2020-04-20 NOTE — ED Notes (Signed)
Patient transported to CT 

## 2020-04-20 NOTE — ED Provider Notes (Signed)
Signout from Dr. Lynelle Doctor.  50 year old male [place] speaking here with myalgias tachycardia.  Rapid COVID-negative.  Labs showing elevated AST and ALT.  Exam equivocal.  Getting CT abdomen and pelvis.  Plan is to follow-up on results of CT and if no acute findings patient can be discharged with outpatient follow-up.  Will need repeat COVID testing. Physical Exam  BP (!) 150/101    Pulse 99    Temp 98.1 F (36.7 C) (Oral)    Resp 18    SpO2 98%   Physical Exam  ED Course/Procedures   Clinical Course as of 04/20/20 1513  Mon Apr 20, 2020  1342 Chest x-ray without acute findings [JK]  1342 White blood cell count elevated 12 [JK]  1343 Rapid COVID test is negative. [JK]  1343 Metabolic panel notable for decreased bicarb as well as elevated LFTs [JK]    Clinical Course User Index [JK] Linwood Dibbles, MD    Procedures  MDM  CT abdomen pelvis showing no acute findings.  CPK mildly elevated and patient has received some IV fluids.  I reviewed this with the patient and his wife.  They are comfortable plan for discharge and follow-up PCP.  Return instructions discussed.      Terrilee Files, MD 04/21/20 1007

## 2020-04-20 NOTE — ED Triage Notes (Signed)
Pt here from home with c/o sob and abd pain

## 2020-04-22 ENCOUNTER — Telehealth: Payer: Self-pay | Admitting: *Deleted

## 2020-04-22 NOTE — Telephone Encounter (Signed)
Pt notified of negative COVID-19 results. Understanding verbalized.   

## 2022-01-23 IMAGING — CT CT ABD-PELV W/ CM
2 of 5 series · 16 of 46 positions shown, 18 images · IV contrast (Omni 300)
Comparison: None.

CLINICAL DATA: Acute generalized abdominal pain.

EXAM:
CT ABDOMEN AND PELVIS WITH CONTRAST
TECHNIQUE: Multidetector CT imaging of the abdomen and pelvis was performed
using the standard protocol following bolus administration of
intravenous contrast.
CONTRAST:  100mL OMNIPAQUE IOHEXOL 300 MG/ML  SOLN

[Series 3: a/p w/ 5mm · axial · 0.75mm/px · z∈[+886,+1316]mm · 13 of 98 slices shown, 15 images]
[im 6/98  soft-tissue]
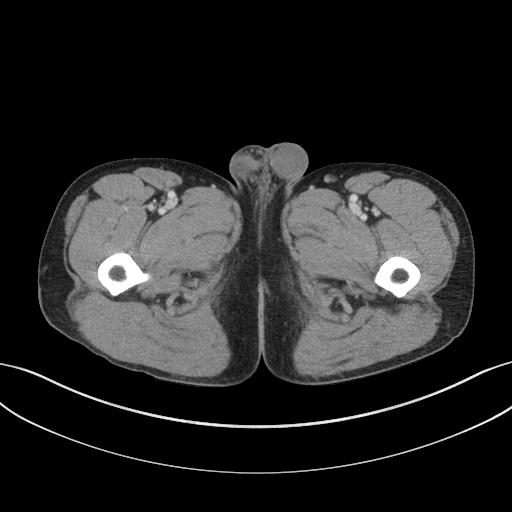
[im 6/98  bone]
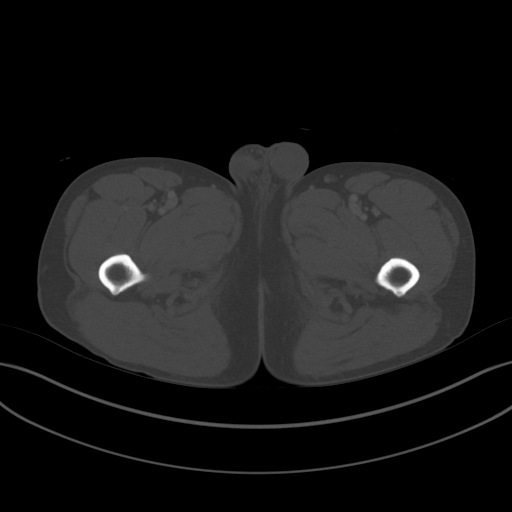
[im 16/98  soft-tissue]
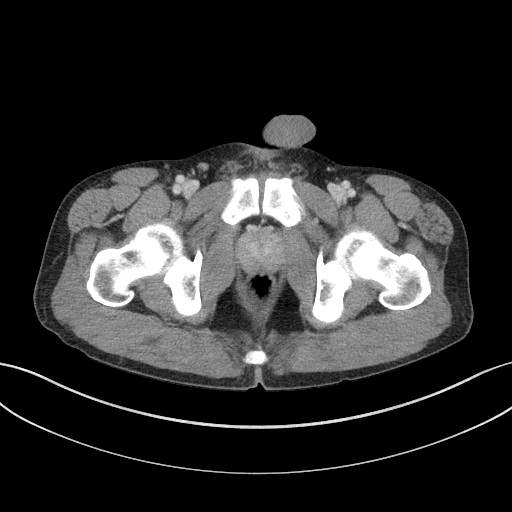
[im 21/98  soft-tissue]
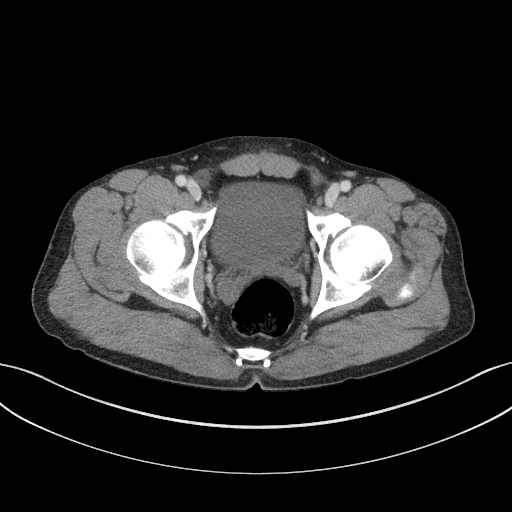
[im 26/98  soft-tissue]
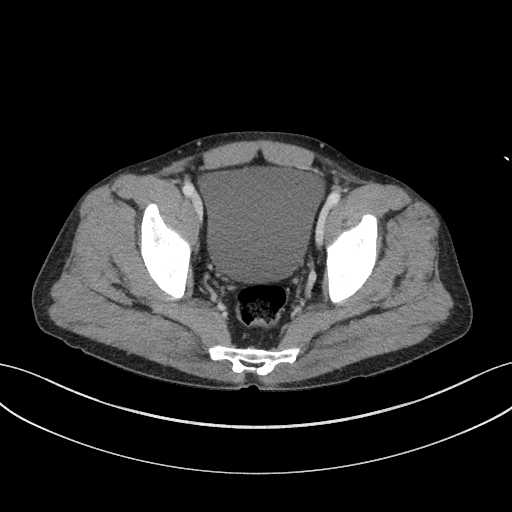
[im 36/98  soft-tissue]
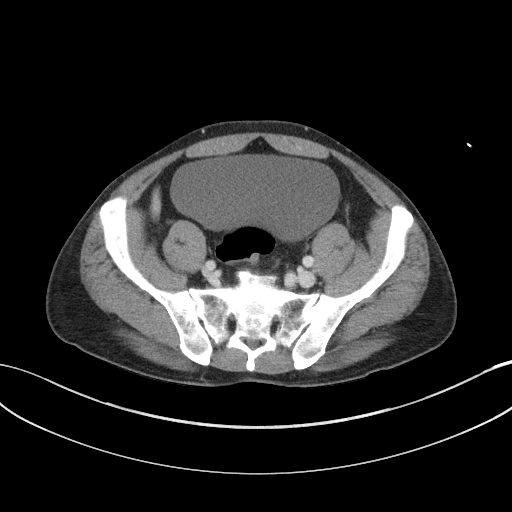
[im 41/98  soft-tissue]
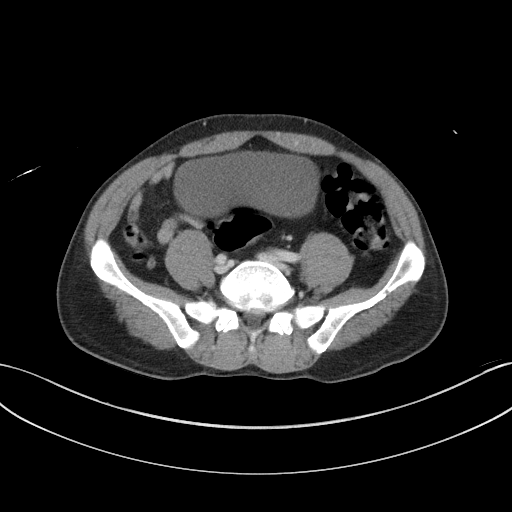
[im 52/98  soft-tissue]
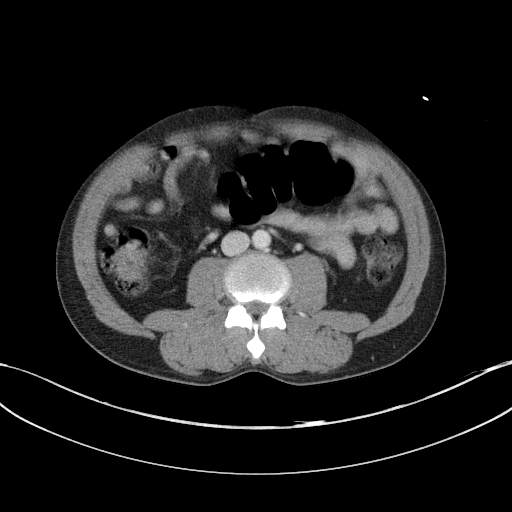
[im 57/98  soft-tissue]
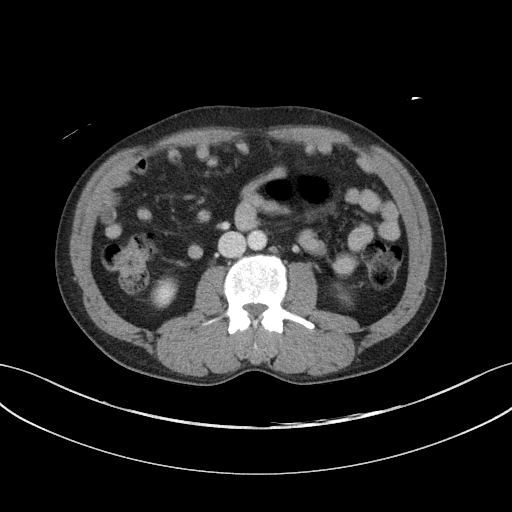
[im 62/98  soft-tissue]
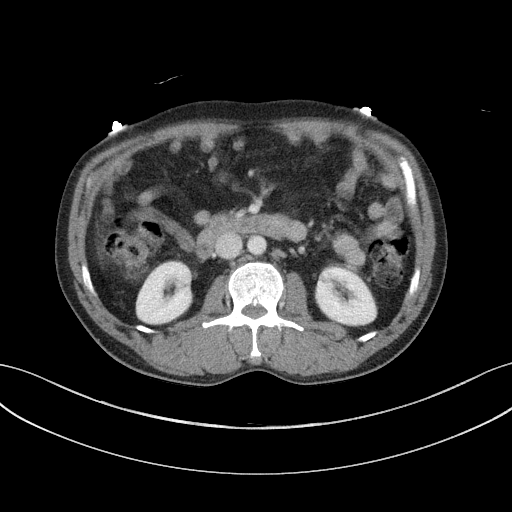
[im 62/98  bone]
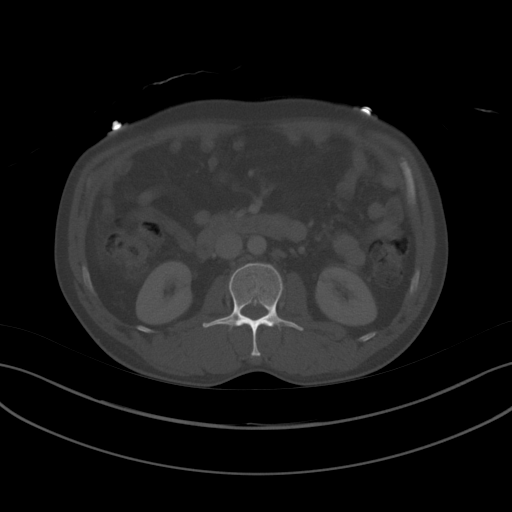
[im 72/98  soft-tissue]
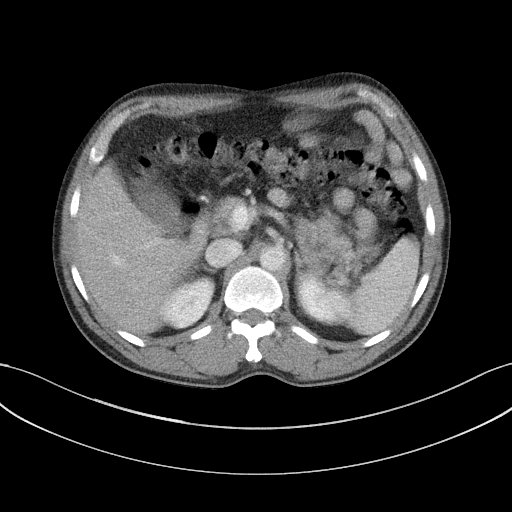
[im 77/98  soft-tissue]
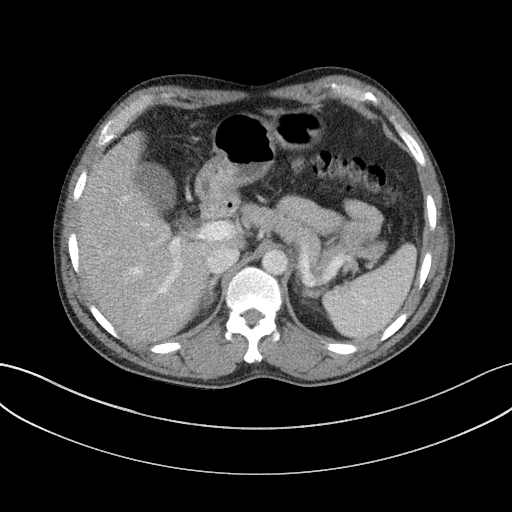
[im 82/98  soft-tissue]
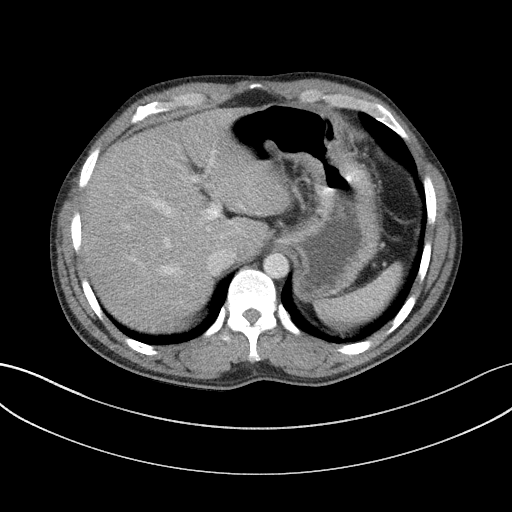
[im 92/98  soft-tissue]
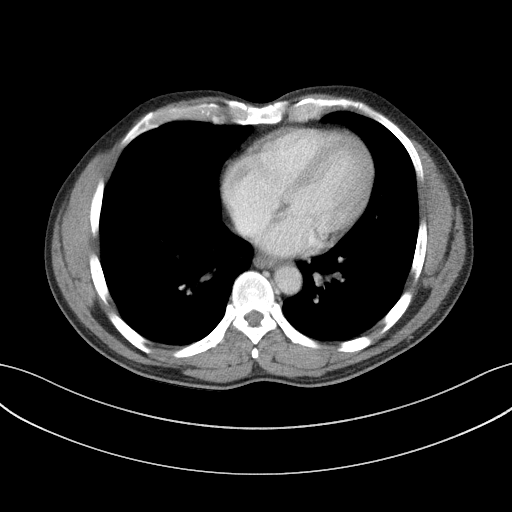

[Series 6: a/p w/ cor · coronal · 0.70mm/px · 3 of 150 slices shown]
[im 50/150  soft-tissue]
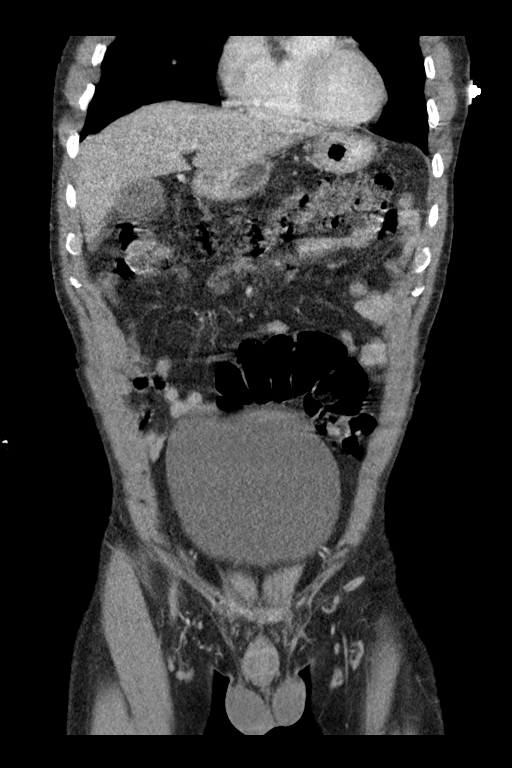
[im 67/150  soft-tissue]
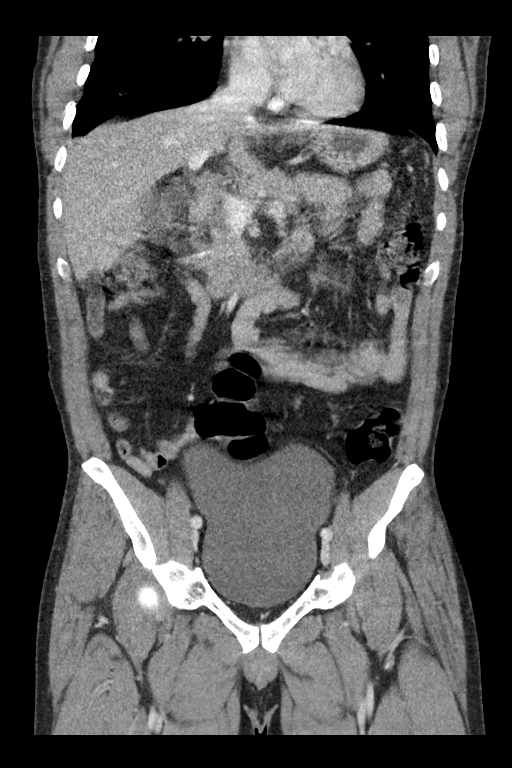
[im 83/150  soft-tissue]
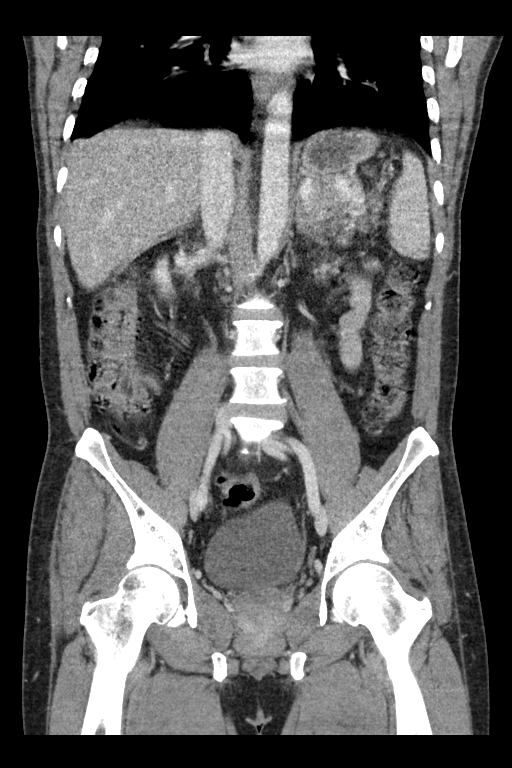

[16 of 46 positions shown; findings below may reference images not displayed]

FINDINGS: Lower chest: No acute abnormality.

Hepatobiliary: No focal liver abnormality is seen. No gallstones,
gallbladder wall thickening, or biliary dilatation.

Pancreas: Unremarkable. No pancreatic ductal dilatation or
surrounding inflammatory changes.

Spleen: Normal in size without focal abnormality.

Adrenals/Urinary Tract: Adrenal glands are unremarkable. Kidneys are
normal, without renal calculi, focal lesion, or hydronephrosis.
Bladder is unremarkable.

Stomach/Bowel: Stomach is within normal limits. Appendix appears
normal. No evidence of bowel wall thickening, distention, or
inflammatory changes.

Vascular/Lymphatic: No significant vascular findings are present. No
enlarged abdominal or pelvic lymph nodes.

Reproductive: Prostate is unremarkable.

Other: No abdominal wall hernia or abnormality. No abdominopelvic
ascites.

Musculoskeletal: No acute or significant osseous findings.
IMPRESSION: No abnormality seen in the abdomen or pelvis.
